# Patient Record
Sex: Male | Born: 1970 | Race: Black or African American | Hispanic: No | Marital: Married | Smoking: Never smoker
Health system: Southern US, Community
[De-identification: ages and names within clinical notes are randomized; demographics above are authoritative.]

## PROBLEM LIST (undated history)

## (undated) DIAGNOSIS — Z992 Dependence on renal dialysis: Secondary | ICD-10-CM

## (undated) HISTORY — PX: LUNG SURGERY: SHX703

---

## 2004-01-14 ENCOUNTER — Emergency Department: Payer: Self-pay | Admitting: Unknown Physician Specialty

## 2004-09-11 ENCOUNTER — Emergency Department: Payer: Self-pay | Admitting: Emergency Medicine

## 2004-09-28 ENCOUNTER — Ambulatory Visit: Payer: Self-pay | Admitting: Orthopedic Surgery

## 2004-10-13 ENCOUNTER — Ambulatory Visit: Payer: Self-pay | Admitting: Orthopedic Surgery

## 2004-11-13 ENCOUNTER — Ambulatory Visit: Payer: Self-pay | Admitting: Orthopedic Surgery

## 2004-12-07 ENCOUNTER — Emergency Department: Payer: Self-pay | Admitting: Internal Medicine

## 2007-05-25 ENCOUNTER — Emergency Department: Payer: Self-pay | Admitting: Emergency Medicine

## 2008-02-06 ENCOUNTER — Inpatient Hospital Stay: Payer: Self-pay | Admitting: Internal Medicine

## 2008-02-14 ENCOUNTER — Ambulatory Visit: Payer: Self-pay | Admitting: Oncology

## 2008-03-15 ENCOUNTER — Ambulatory Visit: Payer: Self-pay | Admitting: Oncology

## 2008-03-31 ENCOUNTER — Ambulatory Visit: Payer: Self-pay | Admitting: Vascular Surgery

## 2008-05-27 ENCOUNTER — Emergency Department: Payer: Self-pay | Admitting: Emergency Medicine

## 2008-07-04 ENCOUNTER — Emergency Department: Payer: Self-pay | Admitting: Emergency Medicine

## 2008-07-07 ENCOUNTER — Emergency Department: Payer: Self-pay | Admitting: Internal Medicine

## 2008-07-08 ENCOUNTER — Emergency Department: Payer: Self-pay | Admitting: Emergency Medicine

## 2008-08-01 ENCOUNTER — Ambulatory Visit: Payer: Self-pay | Admitting: Vascular Surgery

## 2008-08-03 ENCOUNTER — Ambulatory Visit: Payer: Self-pay | Admitting: Vascular Surgery

## 2010-04-09 ENCOUNTER — Emergency Department: Payer: Self-pay | Admitting: Emergency Medicine

## 2012-11-28 ENCOUNTER — Emergency Department: Payer: Self-pay | Admitting: Emergency Medicine

## 2012-11-28 LAB — COMPREHENSIVE METABOLIC PANEL
Alkaline Phosphatase: 100 U/L (ref 50–136)
BUN: 12 mg/dL (ref 7–18)
Bilirubin,Total: 0.9 mg/dL (ref 0.2–1.0)
Calcium, Total: 9.3 mg/dL (ref 8.5–10.1)
Co2: 27 mmol/L (ref 21–32)
Creatinine: 1.06 mg/dL (ref 0.60–1.30)
EGFR (African American): >60
EGFR (Non-African Amer.): >60
SGOT(AST): 21 U/L (ref 15–37)
SGPT (ALT): 28 U/L (ref 12–78)
Sodium: 141 mmol/L (ref 136–145)

## 2012-11-28 LAB — CBC
HCT: 41.2 % (ref 40.0–52.0)
HGB: 13.9 g/dL (ref 13.0–18.0)
RBC: 4.68 10*6/uL (ref 4.40–5.90)
RDW: 12.9 % (ref 11.5–14.5)

## 2012-12-02 ENCOUNTER — Emergency Department: Payer: Self-pay | Admitting: Emergency Medicine

## 2014-10-25 ENCOUNTER — Emergency Department
Admission: EM | Admit: 2014-10-25 | Discharge: 2014-10-25 | Disposition: A | Discharge status: Home / Self Care | Payer: Self-pay | Attending: Emergency Medicine | Admitting: Emergency Medicine

## 2014-10-25 ENCOUNTER — Other Ambulatory Visit: Payer: Self-pay

## 2014-10-25 ENCOUNTER — Emergency Department: Payer: Self-pay

## 2014-10-25 ENCOUNTER — Encounter: Payer: Self-pay | Admitting: Emergency Medicine

## 2014-10-25 DIAGNOSIS — F419 Anxiety disorder, unspecified: Secondary | ICD-10-CM | POA: Insufficient documentation

## 2014-10-25 DIAGNOSIS — R229 Localized swelling, mass and lump, unspecified: Secondary | ICD-10-CM

## 2014-10-25 DIAGNOSIS — F410 Panic disorder [episodic paroxysmal anxiety] without agoraphobia: Secondary | ICD-10-CM

## 2014-10-25 DIAGNOSIS — Z992 Dependence on renal dialysis: Secondary | ICD-10-CM | POA: Insufficient documentation

## 2014-10-25 DIAGNOSIS — M25461 Effusion, right knee: Secondary | ICD-10-CM | POA: Insufficient documentation

## 2014-10-25 DIAGNOSIS — N186 End stage renal disease: Secondary | ICD-10-CM | POA: Insufficient documentation

## 2014-10-25 DIAGNOSIS — R0789 Other chest pain: Secondary | ICD-10-CM

## 2014-10-25 DIAGNOSIS — R51 Headache: Secondary | ICD-10-CM | POA: Insufficient documentation

## 2014-10-25 DIAGNOSIS — M25561 Pain in right knee: Secondary | ICD-10-CM

## 2014-10-25 HISTORY — DX: Dependence on renal dialysis: Z99.2

## 2014-10-25 LAB — TROPONIN I: Troponin I: 0.03 ng/mL (ref <0.031)

## 2014-10-25 LAB — BASIC METABOLIC PANEL
ANION GAP: 5 (ref 5–15)
BUN: 15 mg/dL (ref 6–20)
CHLORIDE: 110 mmol/L (ref 101–111)
CO2: 26 mmol/L (ref 22–32)
CREATININE: 1.34 mg/dL — AB (ref 0.61–1.24)
Calcium: 8.7 mg/dL — ABNORMAL LOW (ref 8.9–10.3)
GFR calc non Af Amer: >60 mL/min (ref >60)
GLUCOSE: 116 mg/dL — AB (ref 65–99)
Potassium: 4.1 mmol/L (ref 3.5–5.1)
Sodium: 141 mmol/L (ref 135–145)

## 2014-10-25 LAB — CBC
HCT: 37.4 % — ABNORMAL LOW (ref 40.0–52.0)
HEMOGLOBIN: 12.1 g/dL — AB (ref 13.0–18.0)
MCH: 29 pg (ref 26.0–34.0)
MCHC: 32.5 g/dL (ref 32.0–36.0)
MCV: 89.2 fL (ref 80.0–100.0)
Platelets: 211 10*3/uL (ref 150–440)
RBC: 4.19 MIL/uL — AB (ref 4.40–5.90)
RDW: 12.9 % (ref 11.5–14.5)
WBC: 5.8 10*3/uL (ref 3.8–10.6)

## 2014-10-25 NOTE — ED Notes (Signed)
Ultrasound at bedside

## 2014-10-25 NOTE — ED Notes (Addendum)
Pt reports panic attack this morning; pt with chest pain afterwards, middle of chest, reports intermittent pain with cough. Pt also with headache since then. Pt also reports right knee pain and swelling x1 month.

## 2014-10-25 NOTE — ED Provider Notes (Signed)
[facility] Emergency Department Provider Note  ____________________________________________  Time seen: 1536  I have reviewed the triage vital signs and the nursing notes.   HISTORY  Chief Complaint Chest Pain and Knee Pain     HPI Bradley Vaughn. is a 44 y.o. male who reports he had a panic attack this morning at approximately 8:00. This was after he had completed and 12 hour overnight shift at his work. He felt panicked and was unable to breathe very well. After the panic attack had subsided he noticed a little bit of chest discomfort. He reported this chest discomfort lasted for approximately one hour and then went away. He now presents to the emergency department, initially with an unclear urgent focus. With the chest pain essentially having resolved it is unclear what his primary concern is. When we discussed this further he reports that he is concerned about his headache. Further discussion finds that the headache is fairly mild and not actually worrisome to the patient. He also expresses concerns for his right knee. He has had swelling and discomfort to the inferior medial portion of the right knee for approximately 2 months. This continues to bother him.  The patient initially tells me he has no other health problems, but after further discussion, it comes out that he had renal failure requiring dialysis from 2008 to 2010.  He reports that he was "cleared" from dialysis and has not seen a doctor since.  The patient is notably overweight. He reports he weighs between 450 and 500 pounds and that this is less than he used to weigh when he weighed over 600 pounds.     Past Medical History  Diagnosis Date  . Dialysis patient     not on it anymore.     There are no active problems to display for this patient.   Past Surgical History  Procedure Laterality Date  . Lung surgery      No current outpatient prescriptions on file.  Allergies Ibuprofen  and Tylenol  No family history on file.  Social History History  Substance Use Topics  . Smoking status: Never Smoker   . Smokeless tobacco: Not on file  . Alcohol Use: No    Review of Systems  Constitutional: Negative for fever. ENT: Negative for sore throat. Cardiovascular: Positive for chest pain that has resolved.Marland Kitchen Respiratory: Positive for shortness of breath during his panic attack this morning. Gastrointestinal: Negative for abdominal pain, vomiting and diarrhea. Genitourinary: Negative for dysuria. Musculoskeletal: Swelling and tenderness to the right knee. See history of present illness Skin: Negative for rash. Neurological: Positive for headache currently, mild   10-point ROS otherwise negative.  ____________________________________________   PHYSICAL EXAM:  VITAL SIGNS: ED Triage Vitals  Enc Vitals Group     BP 10/25/14 1455 131/88 mmHg     Pulse Rate 10/25/14 1455 88     Resp 10/25/14 1455 19     Temp 10/25/14 1455 98.5 F (36.9 C)     Temp Source 10/25/14 1455 Oral     SpO2 10/25/14 1455 100 %     Weight 10/25/14 1455 450 lb (204.119 kg)     Height 10/25/14 1455  (1.854 m)     Head Cir --      Peak Flow --      Pain Score 10/25/14 1456 9     Pain Loc --      Pain Edu? --      Excl. in GC? --  Constitutional: Alert and oriented. Large body habitus. No acute distress. ENT   Head: Normocephalic and atraumatic.   Nose: No congestion/rhinnorhea.   Mouth/Throat: Mucous membranes are moist. Cardiovascular: Normal rate, regular rhythm, no murmur noted Respiratory:  Normal respiratory effort, no tachypnea.    Breath sounds are clear and equal bilaterally.  Gastrointestinal: Soft and nontender. No distention.  Back: No muscle spasm, no tenderness, no CVA tenderness. Musculoskeletal: The patient has a area on the lower inside portion of his right knee that is rather swollen. This areas and approximately 10-12 cm. It is a little bit  tender on palpation. It is fairly circular. Patient does report some point tenderness superior to this area as well. There are no bony deformities. The patient does not appear to have any pitting edema in either leg. He is ambulatory. Neurologic:  Normal speech and language. No gross focal neurologic deficits are appreciated.  Skin:  Skin is warm, dry. No rash noted. Psychiatric: Mood and affect are normal. Speech and behavior are normal.  ____________________________________________    LABS (pertinent positives/negatives)  Labs Reviewed  BASIC METABOLIC PANEL - Abnormal; Notable for the following:    Glucose, Bld 116 (*)    Creatinine, Ser 1.34 (*)    Calcium 8.7 (*)    All other components within normal limits  CBC - Abnormal; Notable for the following:    RBC 4.19 (*)    Hemoglobin 12.1 (*)    HCT 37.4 (*)    All other components within normal limits  TROPONIN I     ____________________________________________   EKG  ED ECG REPORT I, Heiley Shaikh W, the attending physician, personally viewed and interpreted this ECG.   Date: 10/25/2014  EKG Time: 1459  Rate: 83  Rhythm: Normal sinus rhythm  Axis: -15  Intervals: Short PR at 104  ST&T Change: None noted   ____________________________________________    RADIOLOGY  Chest x-ray No acute cardiopulmonary findings  Doppler ultrasound of right leg: IMPRESSION: Negative for deep vein thrombosis in the right lower extremity.   ____________________________________________   INITIAL IMPRESSION / ASSESSMENT AND PLAN / ED COURSE  Pertinent labs & imaging results that were available during my care of the patient were reviewed by me and considered in my medical decision making (see chart for details).  After a lengthy discussion, it appears the patient has numerous smaller concerns. My primary concern for this patient with a history of renal failure and no medical care since his to assess his renal function, to  assess for possible DVT in his right leg. The circular swollen area medial and inferior to the right knee is atypical for a DVT but possible. If the ultrasound does show DVT, we will consider imaging for a possible pulmonary emboli. Evaluation for this may be a little bit difficult due to the patient's body habitus. Of note he is not tachycardic or dyspneic and he has a good oxygen saturation level.  ----------------------------------------- 4:15 PM on 10/25/2014 -----------------------------------------  Renal function is normal.  His hemoglobin is 12.1 but otherwise his CBC is normal. Doppler of the right leg is pending.  ----------------------------------------- 6:39 PM on 10/25/2014 -----------------------------------------  Doppler of the right leg is negative for DVT. Reassessment of the patient at this time finds him comfortable in no acute distress. We further discussed the soft tissue area on his right knee. It is unclear what is forming this. I suspect it may be fatty tissue. I've asked him to follow-up with orthopedics. He has seen [facility]  orthopedics in the past.  ____________________________________________   FINAL CLINICAL IMPRESSION(S) / ED DIAGNOSES  Final diagnoses:  Anxiety attack  Chest tightness  Right knee pain  Soft tissue swelling   - unknown cause, right medial knee.    Darien Ramusavid W Aison Malveaux, MD 10/25/14 [phone]

## 2014-10-25 NOTE — Discharge Instructions (Signed)
It is not clear what is causing the swollen area by your right knee. We did not see a blood clot on ultrasound today. This may be fatty tissue or some other form of soft tissue swelling. [facility] for further evaluation.  Given your significant history with kidney failure before and congestive heart failure, you need to see a doctor on a more regular basis. [facility] to establish an ongoing relationship for further care.  Return to the emergency department if you have further chest pain or other urgent concerns.

## 2014-12-16 ENCOUNTER — Emergency Department: Payer: Self-pay

## 2014-12-16 DIAGNOSIS — W108XXA Fall (on) (from) other stairs and steps, initial encounter: Secondary | ICD-10-CM | POA: Insufficient documentation

## 2014-12-16 DIAGNOSIS — Y998 Other external cause status: Secondary | ICD-10-CM | POA: Insufficient documentation

## 2014-12-16 DIAGNOSIS — Y9289 Other specified places as the place of occurrence of the external cause: Secondary | ICD-10-CM | POA: Insufficient documentation

## 2014-12-16 DIAGNOSIS — N186 End stage renal disease: Secondary | ICD-10-CM | POA: Insufficient documentation

## 2014-12-16 DIAGNOSIS — I120 Hypertensive chronic kidney disease with stage 5 chronic kidney disease or end stage renal disease: Secondary | ICD-10-CM | POA: Insufficient documentation

## 2014-12-16 DIAGNOSIS — Z992 Dependence on renal dialysis: Secondary | ICD-10-CM | POA: Insufficient documentation

## 2014-12-16 DIAGNOSIS — S82141A Displaced bicondylar fracture of right tibia, initial encounter for closed fracture: Secondary | ICD-10-CM | POA: Insufficient documentation

## 2014-12-16 DIAGNOSIS — Y9389 Activity, other specified: Secondary | ICD-10-CM | POA: Insufficient documentation

## 2014-12-16 MED ORDER — TRAMADOL HCL 50 MG PO TABS
50.0000 mg | ORAL_TABLET | Freq: Once | ORAL | Status: AC
Start: 2014-12-17 — End: 2014-12-17
  Administered 2014-12-17: 50 mg via ORAL
  Filled 2014-12-16: qty 1

## 2014-12-16 NOTE — ED Notes (Signed)
Patient reports he was going up 16 steps at home and his right knee gave way.  Patient reports being unable to bear weight at this time.

## 2014-12-17 ENCOUNTER — Emergency Department: Payer: Self-pay

## 2014-12-17 ENCOUNTER — Emergency Department
Admission: EM | Admit: 2014-12-17 | Discharge: 2014-12-17 | Payer: Self-pay | Attending: Emergency Medicine | Admitting: Emergency Medicine

## 2014-12-17 DIAGNOSIS — M25561 Pain in right knee: Secondary | ICD-10-CM

## 2014-12-17 DIAGNOSIS — T1490XA Injury, unspecified, initial encounter: Secondary | ICD-10-CM

## 2014-12-17 DIAGNOSIS — S82141A Displaced bicondylar fracture of right tibia, initial encounter for closed fracture: Secondary | ICD-10-CM

## 2014-12-17 LAB — BASIC METABOLIC PANEL
Anion gap: 4 — ABNORMAL LOW (ref 5–15)
BUN: 11 mg/dL (ref 6–20)
CHLORIDE: 111 mmol/L (ref 101–111)
CO2: 26 mmol/L (ref 22–32)
CREATININE: 1.37 mg/dL — AB (ref 0.61–1.24)
Calcium: 8.7 mg/dL — ABNORMAL LOW (ref 8.9–10.3)
GFR calc Af Amer: >60 mL/min (ref >60)
GFR calc non Af Amer: >60 mL/min (ref >60)
GLUCOSE: 116 mg/dL — AB (ref 65–99)
POTASSIUM: 3.8 mmol/L (ref 3.5–5.1)
SODIUM: 141 mmol/L (ref 135–145)

## 2014-12-17 LAB — CBC
HEMATOCRIT: 36.8 % — AB (ref 40.0–52.0)
Hemoglobin: 12.3 g/dL — ABNORMAL LOW (ref 13.0–18.0)
MCH: 29.1 pg (ref 26.0–34.0)
MCHC: 33.4 g/dL (ref 32.0–36.0)
MCV: 87.1 fL (ref 80.0–100.0)
Platelets: 198 10*3/uL (ref 150–440)
RBC: 4.22 MIL/uL — AB (ref 4.40–5.90)
RDW: 12.4 % (ref 11.5–14.5)
WBC: 6.8 10*3/uL (ref 3.8–10.6)

## 2014-12-17 LAB — PROTIME-INR
INR: 1.01
PROTHROMBIN TIME: 13.5 s (ref 11.4–15.0)

## 2014-12-17 MED ORDER — MORPHINE SULFATE (PF) 4 MG/ML IV SOLN
INTRAVENOUS | Status: AC
  Administered 2014-12-17: 4 mg via INTRAVENOUS
  Filled 2014-12-17: qty 1

## 2014-12-17 MED ORDER — MORPHINE SULFATE (PF) 4 MG/ML IV SOLN
4.0000 mg | Freq: Once | INTRAVENOUS | Status: AC
  Administered 2014-12-17: 4 mg via INTRAVENOUS

## 2014-12-17 MED ORDER — MORPHINE SULFATE (PF) 4 MG/ML IV SOLN
4.0000 mg | Freq: Once | INTRAVENOUS | Status: AC
  Administered 2014-12-17: 4 mg via INTRAVENOUS
  Filled 2014-12-17: qty 1

## 2014-12-17 MED ORDER — OXYCODONE HCL 5 MG PO TABS
10.0000 mg | ORAL_TABLET | Freq: Once | ORAL | Status: DC
  Filled 2014-12-17: qty 2

## 2014-12-17 MED ORDER — ONDANSETRON HCL 4 MG/2ML IJ SOLN
4.0000 mg | Freq: Once | INTRAMUSCULAR | Status: AC
  Administered 2014-12-17: 4 mg via INTRAVENOUS
  Filled 2014-12-17: qty 2

## 2014-12-17 NOTE — ED Provider Notes (Signed)
[facility] Emergency Department Provider Note  Time seen: 1:23 AM  I have reviewed the triage vital signs and the nursing notes.   HISTORY  Chief Complaint Knee Pain    HPI Bradley Vaughn. is a 44 y.o. male with a past medical history of hypertension, end-stage renal disease on dialysis, morbid obesity, who presents the emergency department with right knee pain. According to the patient he was going up stairs when his knee gave way and he fell forward onto his knee. States immediate pain to the knee, has been unable to bear weight since. Denies any other injuries. Describes his pain as moderate, much worse with attempted ambulation.     Past Medical History  Diagnosis Date  . Dialysis patient     not on it anymore.     There are no active problems to display for this patient.   Past Surgical History  Procedure Laterality Date  . Lung surgery      No current outpatient prescriptions on file.  Allergies Ibuprofen and Tylenol  No family history on file.  Social History Social History  Substance Use Topics  . Smoking status: Never Smoker   . Smokeless tobacco: Not on file  . Alcohol Use: No    Review of Systems Constitutional: Negative for fever. Cardiovascular: Negative for chest pain. Respiratory: Negative for shortness of breath Musculoskeletal: Positive for right knee pain. Neurological: Negative for headache 10-point ROS otherwise negative.  ____________________________________________   PHYSICAL EXAM:  VITAL SIGNS: ED Triage Vitals  Enc Vitals Group     BP 12/16/14 2343 139/83 mmHg     Pulse Rate 12/16/14 2343 103     Resp 12/16/14 2343 20     Temp 12/16/14 2343 99 F (37.2 C)     Temp Source 12/16/14 2343 Oral     SpO2 12/16/14 2343 97 %     Weight 12/16/14 2341 455 lb (206.387 kg)     Height 12/16/14 2341  (1.854 m)     Head Cir --      Peak Flow --      Pain Score 12/17/14 0116 10     Pain Loc --    Pain Edu? --      Excl. in GC? --     Constitutional: Alert and oriented. Well appearing and in no distress. Eyes: Normal exam ENT   Mouth/Throat: Mucous membranes are moist. Cardiovascular: Normal rate, regular rhythm. No murmur Respiratory: Normal respiratory effort without tachypnea nor retractions. Breath sounds are clear  Gastrointestinal: Soft and nontender. No distention.  Morbidly obese. Musculoskeletal: Moderate tenderness of the right knee, much worse with attempted range of motion. Neurovascularly intact distally, warm, and sensation intact. Extremities otherwise atraumatic. Neurologic:  Normal speech and language. No gross focal neurologic deficits  Skin:  Skin is warm, dry and intact.  Psychiatric: Mood and affect are normal. Speech and behavior are normal.   ____________________________________________   RADIOLOGY  X-ray consistent with depressed lateral plateau fracture.  ____________________________________________   INITIAL IMPRESSION / ASSESSMENT AND PLAN / ED COURSE  Pertinent labs & imaging results that were available during my care of the patient were reviewed by me and considered in my medical decision making (see chart for details).  X-ray consistent with depressed lateral tibial plateau fracture after fall from standing height. Patient unable to bear weight at this time. We will discuss the patient with orthopedics, treat with oxycodone.  I discussed with our orthopedist Dr. Joice Lofts, due to  the patient's morbid obesity he does not believe the patient could be adequately treated at [facility]. I discussed the patient with [facility] orthopedics. Patient stated the patient will likely not require emergent/immediate surgery. However as the patient is 450 pounds and lives on a second story apartment without an elevator I do not believe the patient will be able to be safely discharged home as he cannot airway on the right leg. All discussed with [facility]  transfer for admission and orthopedic evaluation.  Discussed with [facility], they will except as an ER to ER transfer for further evaluation. ____________________________________________   FINAL CLINICAL IMPRESSION(S) / ED DIAGNOSES  Right knee tibial plateau fracture   Minna Antis, MD 12/17/14 [phone]

## 2014-12-17 NOTE — ED Notes (Signed)
Pt. States just before midnight tonight pt. Was walking and pt. States "my right leg gave out on me".  Pt. Denies trauma to rt. Knee.

## 2015-01-14 DEATH — deceased

## 2017-06-22 IMAGING — CR DG CHEST 2V
2 series · 2 of 2 positions shown · non-contrast
Comparison: 02/22/2008

CLINICAL DATA: Left-sided chest pain since this morning.

EXAM:
CHEST  2 VIEW

[chest pa]
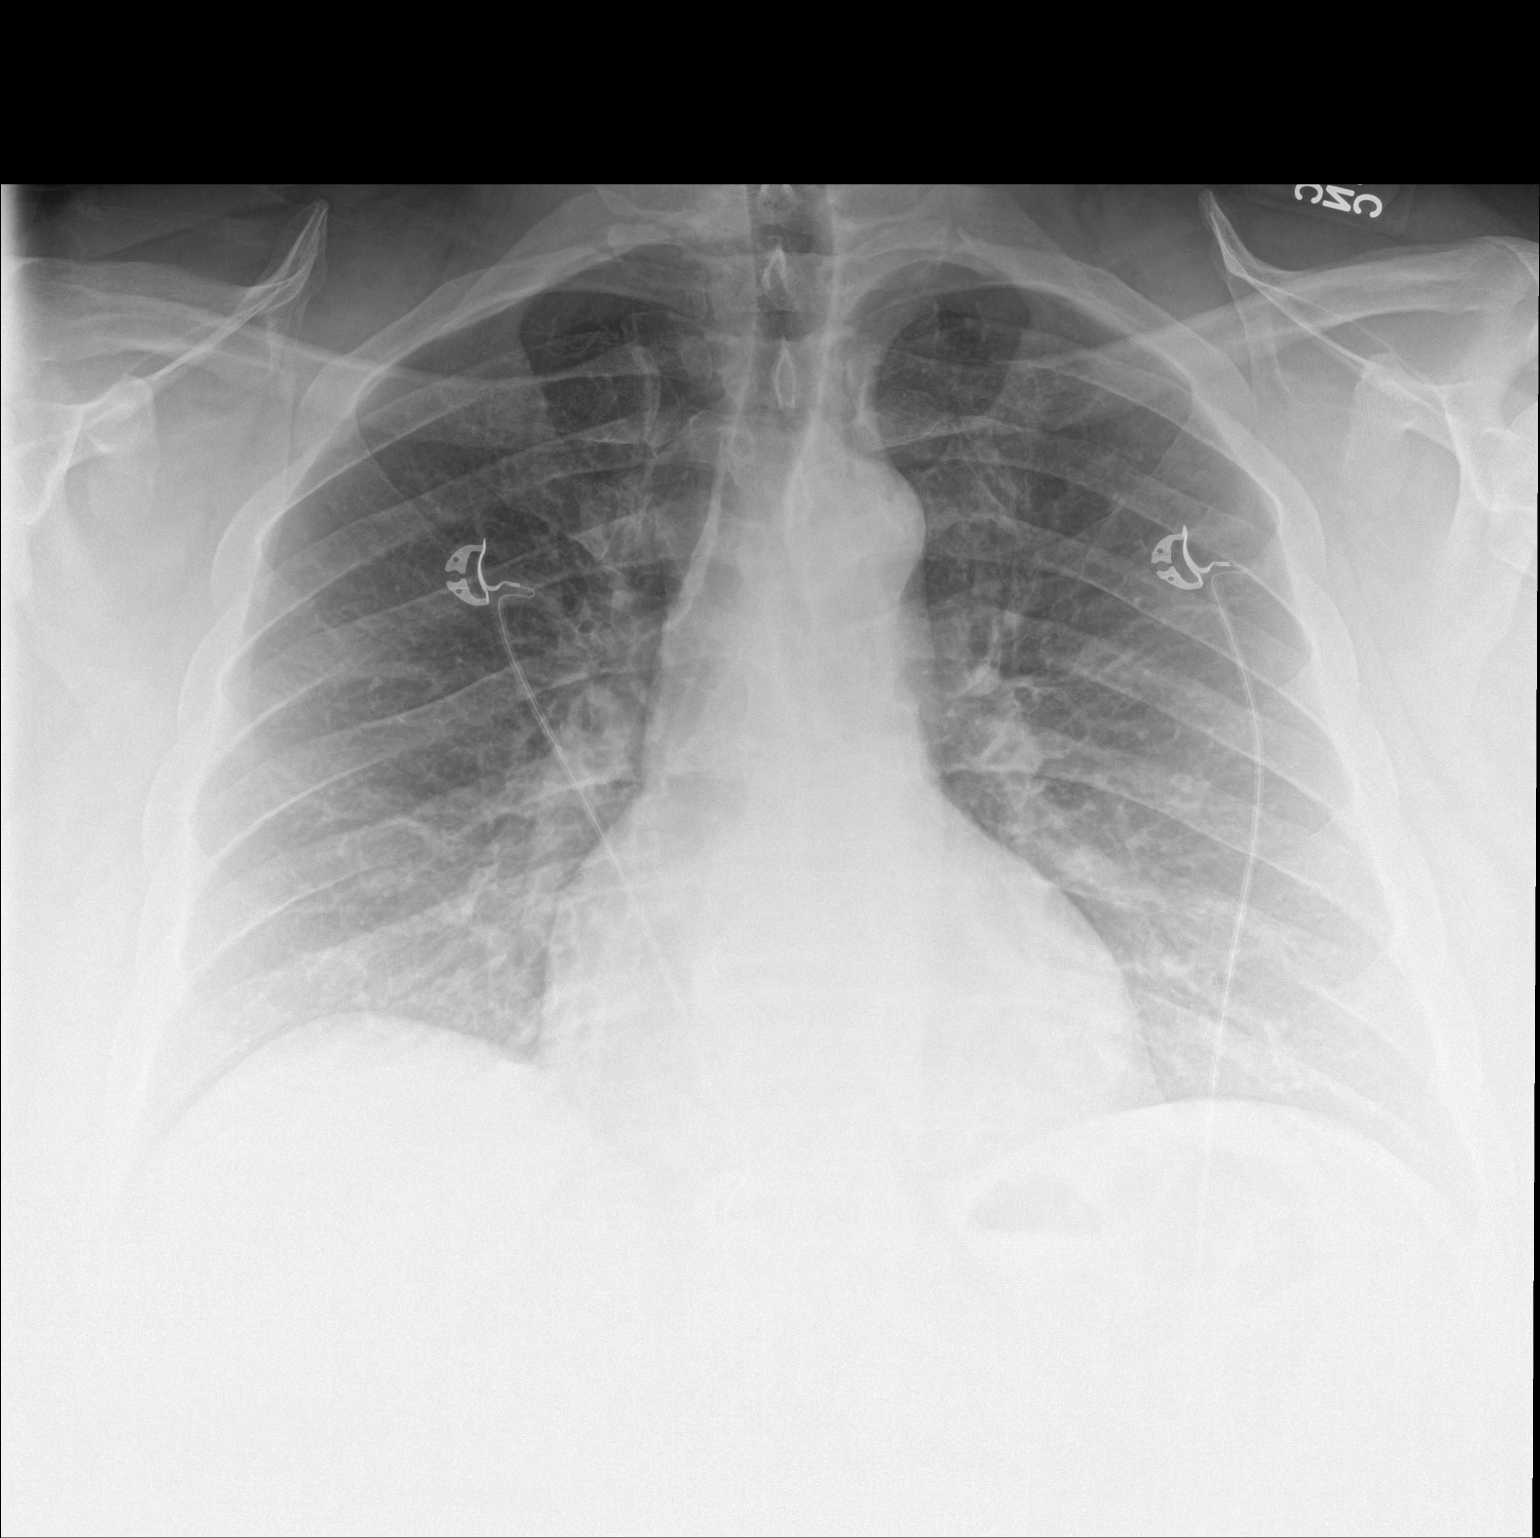

[chest lat]
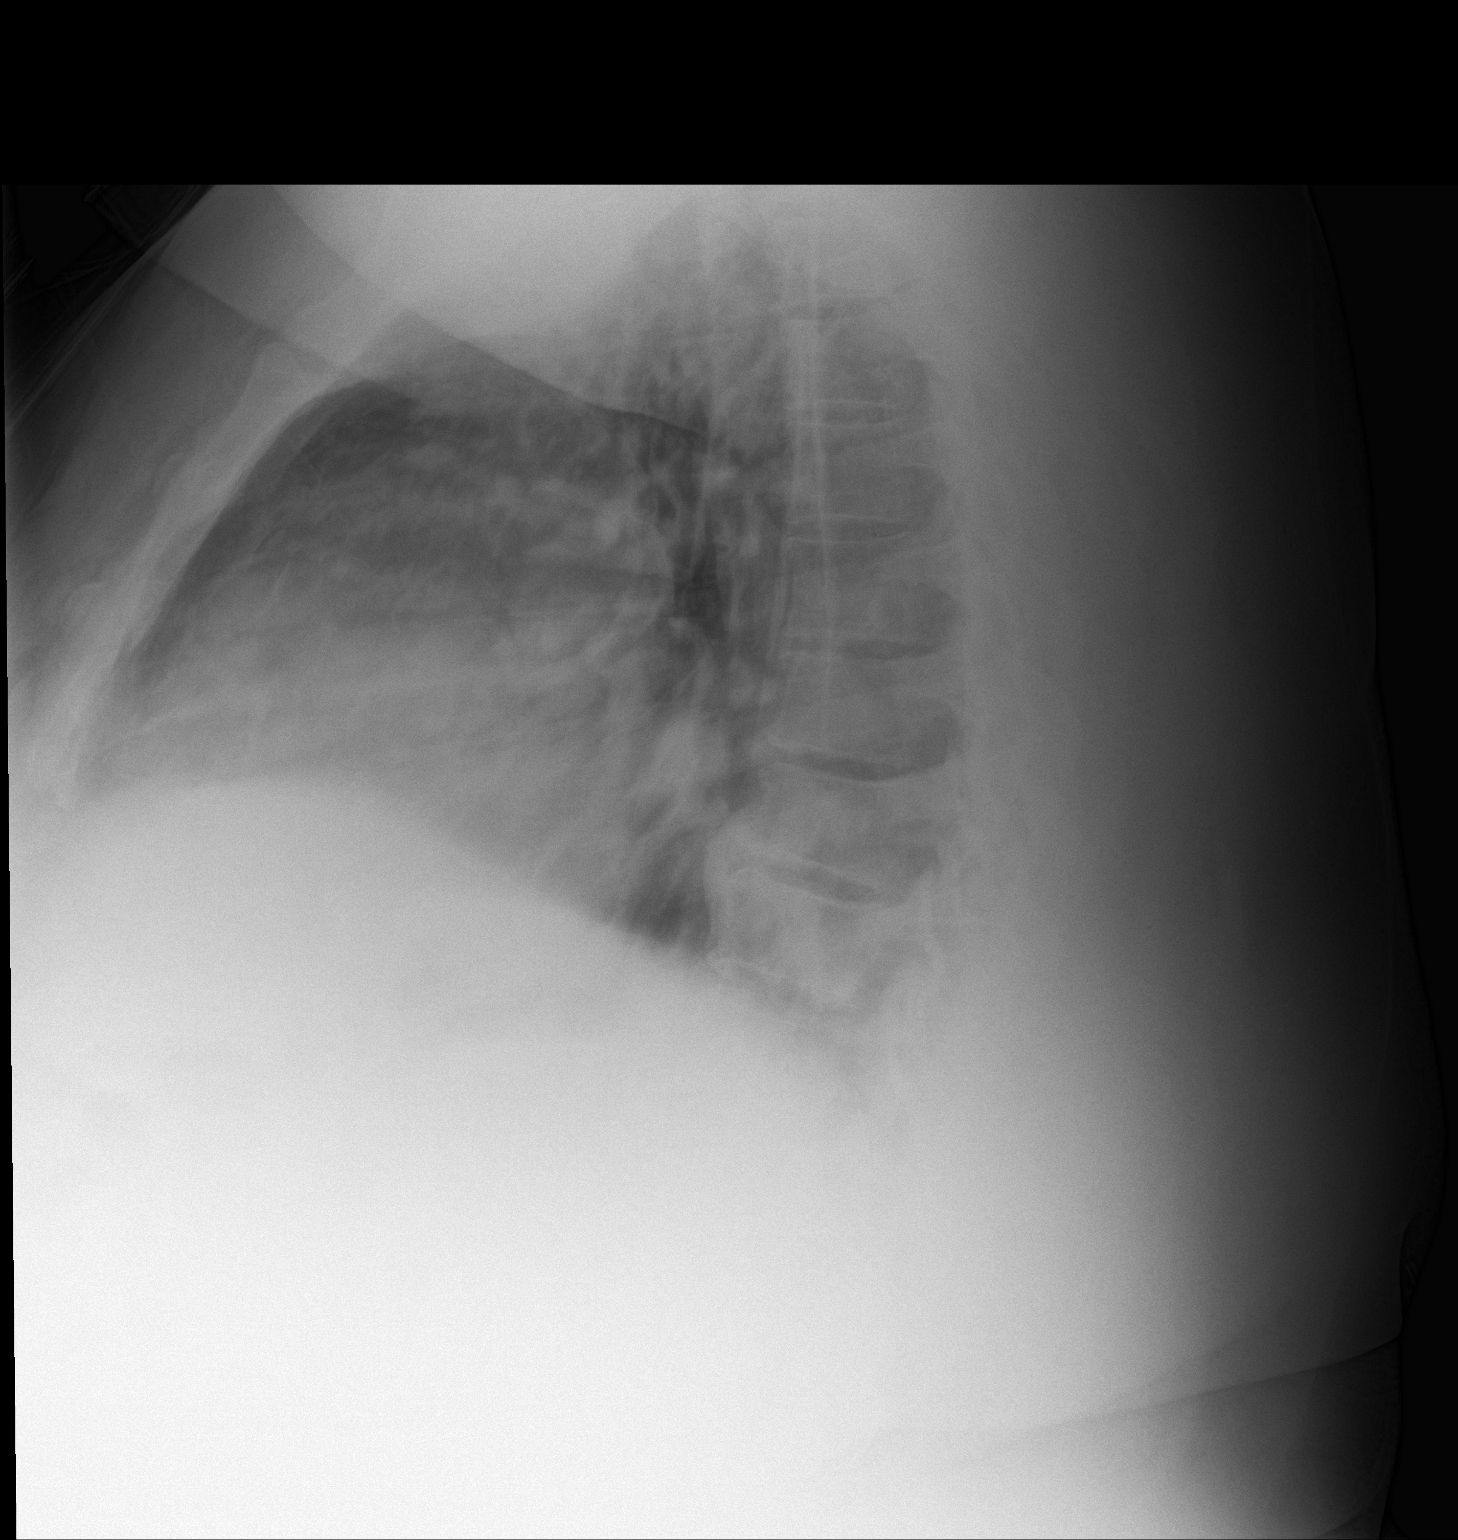

[2 of 2 positions shown; findings below may reference images not displayed]

FINDINGS: The cardiac silhouette, mediastinal and hilar contours are within
normal limits. The lungs are clear. No pleural effusion. The bony
thorax is intact.
IMPRESSION: No acute cardiopulmonary findings.
